# Patient Record
Sex: Male | Born: 1950 | Race: White | Hispanic: No | Marital: Married | Smoking: Never smoker
Health system: Southern US, Community
[De-identification: ages and names within clinical notes are randomized; demographics above are authoritative.]

---

## 2009-08-14 HISTORY — PX: CHOLECYSTECTOMY: SHX55

## 2010-05-18 ENCOUNTER — Inpatient Hospital Stay (HOSPITAL_COMMUNITY): Admission: EM | Admit: 2010-05-18 | Discharge: 2010-05-20 | Payer: Self-pay | Admitting: Emergency Medicine

## 2010-08-03 ENCOUNTER — Ambulatory Visit (HOSPITAL_COMMUNITY)
Admission: RE | Admit: 2010-08-03 | Discharge: 2010-08-03 | Payer: Self-pay | Source: Home / Self Care | Attending: General Surgery | Admitting: General Surgery

## 2010-10-24 LAB — COMPREHENSIVE METABOLIC PANEL
ALT: 32 U/L (ref 0–53)
Alkaline Phosphatase: 55 U/L (ref 39–117)
BUN: 14 mg/dL (ref 6–23)
CO2: 27 mEq/L (ref 19–32)
Creatinine, Ser: 1.03 mg/dL (ref 0.4–1.5)
GFR calc non Af Amer: >60 mL/min (ref >60)
Glucose, Bld: 92 mg/dL (ref 70–99)
Potassium: 4.4 mEq/L (ref 3.5–5.1)
Sodium: 140 mEq/L (ref 135–145)
Total Bilirubin: 0.7 mg/dL (ref 0.3–1.2)
Total Protein: 7.5 g/dL (ref 6.0–8.3)

## 2010-10-24 LAB — CBC
HCT: 44.9 % (ref 39.0–52.0)
Hemoglobin: 15.6 g/dL (ref 13.0–17.0)
MCH: 32.1 pg (ref 26.0–34.0)
MCHC: 34.7 g/dL (ref 30.0–36.0)
WBC: 6.8 10*3/uL (ref 4.0–10.5)

## 2010-10-24 LAB — DIFFERENTIAL
Basophils Relative: 1 % (ref 0–1)
Eosinophils Absolute: 0.1 10*3/uL (ref 0.0–0.7)
Neutrophils Relative %: 60 % (ref 43–77)

## 2010-10-24 LAB — SURGICAL PCR SCREEN: Staphylococcus aureus: POSITIVE — AB

## 2010-10-24 LAB — LIPASE, BLOOD: Lipase: 32 U/L (ref 11–59)

## 2010-10-27 LAB — CBC
Hemoglobin: 13.5 g/dL (ref 13.0–17.0)
MCH: 31.8 pg (ref 26.0–34.0)
MCH: 32.7 pg (ref 26.0–34.0)
MCHC: 34.7 g/dL (ref 30.0–36.0)
MCHC: 35.1 g/dL (ref 30.0–36.0)
Platelets: 189 10*3/uL (ref 150–400)
Platelets: 231 10*3/uL (ref 150–400)
RDW: 12.8 % (ref 11.5–15.5)

## 2010-10-27 LAB — PROTIME-INR
INR: 1.05 (ref 0.00–1.49)
Prothrombin Time: 13.9 seconds (ref 11.6–15.2)

## 2010-10-27 LAB — COMPREHENSIVE METABOLIC PANEL
ALT: 285 U/L — ABNORMAL HIGH (ref 0–53)
AST: 142 U/L — ABNORMAL HIGH (ref 0–37)
AST: 52 U/L — ABNORMAL HIGH (ref 0–37)
AST: 66 U/L — ABNORMAL HIGH (ref 0–37)
Albumin: 3.1 g/dL — ABNORMAL LOW (ref 3.5–5.2)
Albumin: 3.8 g/dL (ref 3.5–5.2)
BUN: 20 mg/dL (ref 6–23)
BUN: 8 mg/dL (ref 6–23)
CO2: 25 mEq/L (ref 19–32)
CO2: 27 mEq/L (ref 19–32)
Calcium: 8.5 mg/dL (ref 8.4–10.5)
Calcium: 8.8 mg/dL (ref 8.4–10.5)
Calcium: 9.6 mg/dL (ref 8.4–10.5)
Chloride: 111 mEq/L (ref 96–112)
Creatinine, Ser: 1.16 mg/dL (ref 0.4–1.5)
Creatinine, Ser: 1.2 mg/dL (ref 0.4–1.5)
Creatinine, Ser: 1.31 mg/dL (ref 0.4–1.5)
GFR calc Af Amer: >60 mL/min (ref >60)
GFR calc Af Amer: >60 mL/min (ref >60)
GFR calc Af Amer: >60 mL/min (ref >60)
GFR calc non Af Amer: 56 mL/min — ABNORMAL LOW (ref >60)
GFR calc non Af Amer: >60 mL/min (ref >60)
GFR calc non Af Amer: >60 mL/min (ref >60)
Sodium: 140 mEq/L (ref 135–145)
Total Bilirubin: 1.3 mg/dL — ABNORMAL HIGH (ref 0.3–1.2)
Total Protein: 5.9 g/dL — ABNORMAL LOW (ref 6.0–8.3)

## 2010-10-27 LAB — URINALYSIS, ROUTINE W REFLEX MICROSCOPIC
Nitrite: NEGATIVE
Specific Gravity, Urine: 1.031 — ABNORMAL HIGH (ref 1.005–1.030)
Urobilinogen, UA: 0.2 mg/dL (ref 0.0–1.0)
pH: 5.5 (ref 5.0–8.0)

## 2010-10-27 LAB — DIFFERENTIAL
Eosinophils Relative: 2 % (ref 0–5)
Lymphocytes Relative: 24 % (ref 12–46)
Lymphs Abs: 2.1 10*3/uL (ref 0.7–4.0)
Neutro Abs: 5.9 10*3/uL (ref 1.7–7.7)

## 2010-10-27 LAB — LIPID PANEL
HDL: 35 mg/dL — ABNORMAL LOW (ref >39)
LDL Cholesterol: 98 mg/dL (ref 0–99)
Triglycerides: 103 mg/dL (ref <150)
VLDL: 21 mg/dL (ref 0–40)

## 2010-10-27 LAB — APTT: aPTT: 27 seconds (ref 24–37)

## 2010-10-27 LAB — LIPASE, BLOOD: Lipase: 386 U/L — ABNORMAL HIGH (ref 11–59)

## 2016-08-14 HISTORY — PX: INGUINAL HERNIA REPAIR: SUR1180

## 2016-11-01 ENCOUNTER — Ambulatory Visit: Payer: Self-pay | Admitting: Surgery

## 2016-11-01 ENCOUNTER — Encounter: Payer: Self-pay | Admitting: Surgery

## 2016-11-01 NOTE — H&P (Signed)
Anthony Shaffer 11/01/2016 4:20 PM Location: [facility] Patient #: [id number] DOB: [birth year] Separated / Language: [place] / Race: White Male  History of Present Illness Anthony Sportsman MD; 11/01/2016 5:05 PM) The patient is a 66 year old male who presents with an inguinal hernia. Note for "Inguinal hernia": Patient sent for surgical consultation at the request of Dr. Henrine Screws, primary care physician. Concern for large right inguinal hernia.  Pleasant active male. 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 Had of left inguinal hernia repair done in an open fashion with mesh when he worked up and [employer], about 20 years ago. 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 Had gallstone pancreatitis with a cholecystectomy 2011 bilateral partner, Dr. Emelia Loron. I started some groin swelling over the past few months on the right side. Worse when lifting and activity. Concern for inguinal hernia on the right side. Saw primary care physician. Suspicion confirmed. Surgical consultation requested. Patient wonders if he has a recurrence on the left side as well although denies really any lump or significant pain.  Patient usually is on the treadmill at least 15 minutes a day. Moderately active. Moves his bowels every day. He does not smoke. No history of cardiopulmonary events. No history of skin infections or wound infections. Usually has to get up at night to urinate a couple times a night but nothing too severe. He is not on any prostate medications. He recalls many family members having hernia repairs. He wonders if this is genetic.   Past Surgical History Anthony Shaffer, [place]; 11/01/2016 4:20 PM) Gallbladder Surgery - Laparoscopic Open Inguinal Hernia Surgery Left.  Diagnostic Studies History Anthony Shaffer, [place]; 11/01/2016 4:20 PM) Colonoscopy never  Allergies Anthony Shaffer [place], [place]; 11/01/2016 4:20 PM) No Known Drug Allergies  11/01/2016 No Known Allergies 11/01/2016 Allergies Reconciled  Medication History Anthony Shaffer, CMA; 11/01/2016 4:21 PM) Atorvastatin Calcium (10MG  Tablet, Oral daily) Active. Multi-Minerals (Oral daily) Active. Vitamin D (Cholecalciferol) (1000UNIT Capsule, Oral daily) Active. Medications Reconciled  Social History Anthony Sportsman, MD; 11/01/2016 4:41 PM) Alcohol use Occasional alcohol use. Caffeine use Tea. No drug use Tobacco use Former smoker. 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 Skin Not Present- Change in Wart/Mole, Dryness, Hives, Jaundice, New Lesions, Non-Healing Wounds, Rash and Ulcer. HEENT Not Present- Earache, Hearing Loss, Hoarseness, Nose Bleed, Oral Ulcers, Ringing in the Ears, Seasonal Allergies, Sinus Pain, Sore Throat, Visual Disturbances, Wears glasses/contact lenses and Yellow Eyes. Respiratory Not Present- Bloody sputum, Chronic Cough, Difficulty Breathing, Snoring and Wheezing. Breast Not Present- Breast Mass, Breast Pain, Nipple Discharge and Skin Changes. Cardiovascular Not Present- Chest Pain, Difficulty Breathing Lying Down, Leg Cramps, Palpitations, Rapid Heart Rate, Shortness of Breath and Swelling of Extremities. Gastrointestinal Not Present- Abdominal Pain, Bloating, Bloody Stool, Change in Bowel Habits, Chronic diarrhea, Constipation, Difficulty Swallowing, Excessive gas, Gets full quickly at meals, Hemorrhoids, Indigestion, Nausea, Rectal Pain and Vomiting. Male Genitourinary Not Present- Blood in Urine, Change in Urinary Stream, Frequency, Impotence, Nocturia, Painful Urination,  Urgency and Urine Leakage. Musculoskeletal Not Present- Back Pain, Joint Pain, Joint Stiffness, Muscle Pain, Muscle Weakness and Swelling of Extremities. Neurological Not Present- Decreased Memory, Fainting, Headaches, Numbness, Seizures, Tingling,  Tremor, Trouble walking and Weakness. Psychiatric Not Present- Anxiety, Bipolar, Change in Sleep Pattern, Depression, Fearful and Frequent crying. Endocrine Not Present- Cold Intolerance, Excessive Hunger, Hair Changes, Heat Intolerance, Hot flashes and New Diabetes. Hematology Not Present- Blood Thinners, Easy Bruising, Excessive bleeding, Gland problems, HIV and Persistent Infections.  Vitals Anthony Shaffer [place] CMA; 11/01/2016 4:21 PM) 11/01/2016 4:21 PM Weight: 184 lb Height: 68in Body Surface Area: 1.97 m Body Mass Index: 27.98 kg/m  Temp.: 98.4F  Pulse: 68 (Regular)  BP: 112/62 (Sitting, Left Arm, Standard)      Physical Exam Anthony Sportsman MD; 11/01/2016 5:02 PM)  Abdomen Note: Obvious periumbilical hernia. 1 cm reducible. No diastases. Well-healed laparoscopic incisions. Minimal diastases recti supraumbilically. No guarding.  Male Genitourinary Note: Obvious right groin bulge consistent with moderate inguinal hernia going down into proximal scrotum. Ultimately reducible. Sensitive but not severe. Possible subtle impulse left lateral groin but no major recurrent hernia.  Normal external genitalia. Epididymi, testes, and spermatic cords normal without any masses.    Assessment & Plan Anthony Sportsman MD; 11/01/2016 5:02 PM)  RIGHT INGUINAL HERNIA (K40.90) Impression: Moderate size right inguinal hernia going into proximal scrotum. Ultimately reducible. No definite hernia on the left side but some sensitivity laterally. Small incisional hernia at bellybutton.  Think he would benefit from laparoscopic repair of hernias found. Umbilical incisional hernia most likely could be repaired with primary repair. If he is interested  in proceeding with surgery at some point. He is concerned he has a very busy schedule. Even the summertime is filling up. He will look at dates and call us back. Most likely he will wait until after the spring semester is over in the next 6-8 weeks.  INCISIONAL HERNIA, WITHOUT OBSTRUCTION OR GANGRENE (K43.2) Impression: Small periumbilical incisional hernia most likely related to the cholecystectomy done urgently 7 years ago. Probably just needs primary repair only  PREOP - ING HERNIA - ENCOUNTER FOR PREOPERATIVE EXAMINATION FOR GENERAL SURGICAL PROCEDURE (Z01.818)  Current Plans You are being scheduled for surgery- Our schedulers will call you.  You should hear from our office's scheduling department within 5 working days about the location, date, and time of surgery. We try to make accommodations for patient's preferences in scheduling surgery, but sometimes the OR schedule or the surgeon's schedule prevents [place] from making those accommodations.  If you have not heard from our office [phone]) in 5 working days, call the office and ask for your surgeon's nurse.  If you have other questions about your diagnosis, plan, or surgery, call the office and ask for your surgeon's nurse.  Written instructions provided The anatomy & physiology of the abdominal wall and pelvic floor was discussed. The pathophysiology of hernias in the inguinal and pelvic region was discussed. Natural history risks such as progressive enlargement, pain, incarceration, and strangulation was discussed. Contributors to complications such as smoking, obesity, diabetes, prior surgery, etc were discussed.  I feel the risks of no intervention will lead to serious problems that outweigh the operative risks; therefore, I recommended surgery to reduce and repair the hernia. I explained laparoscopic techniques with possible need for an open approach. I noted usual use of mesh to patch and/or buttress hernia  repair  Risks such as bleeding, infection, abscess, need for further treatment, heart attack, death, and other risks were discussed. I noted a good likelihood this will help address the problem. Goals of post-operative recovery were discussed as well. Possibility that this will not correct all symptoms was explained. I stressed  the importance of low-impact activity, aggressive pain control, avoiding constipation, & not pushing through pain to minimize risk of post-operative chronic pain or injury. Possibility of reherniation was discussed. We will work to minimize complications.  An educational handout further explaining the pathology & treatment options was given as well. Questions were answered. The patient expresses understanding & wishes to proceed with surgery.  Pt Education - Pamphlet Given - Laparoscopic Hernia Repair: discussed with patient and provided information. Pt Education - [facility] Pain Control (Mickenzie Stolar) Pt Education - [facility] Hernia Post-Op HCI (Conleigh Heinlein): discussed with patient and provided information.  Anthony SportsmanSteven C. Kitrina Maurin, M.D., F.A.C.S. Gastrointestinal and Minimally Invasive Surgery [facility] [address] [place], [place] [id number] [phone] Main / Paging

## 2018-08-16 ENCOUNTER — Encounter: Payer: Self-pay | Admitting: Family Medicine

## 2018-08-16 ENCOUNTER — Ambulatory Visit: Payer: Self-pay | Admitting: Family Medicine

## 2018-08-16 ENCOUNTER — Ambulatory Visit (INDEPENDENT_AMBULATORY_CARE_PROVIDER_SITE_OTHER): Payer: BC Managed Care – PPO | Admitting: Family Medicine

## 2018-08-16 DIAGNOSIS — M545 Low back pain, unspecified: Secondary | ICD-10-CM | POA: Insufficient documentation

## 2018-08-16 MED ORDER — CYCLOBENZAPRINE HCL 10 MG PO TABS: 10.0000 mg | ORAL_TABLET | Freq: Three times a day (TID) | ORAL | 0 refills | Status: AC | PRN

## 2018-08-16 MED ORDER — MELOXICAM 15 MG PO TABS: 15.0000 mg | ORAL_TABLET | Freq: Every day | ORAL | 0 refills | Status: AC

## 2018-08-16 NOTE — Progress Notes (Signed)
Anthony Shaffer - 68 y.o. male MRN [id number]  Date of birth: November 25, 1950  Subjective Chief Complaint  Patient presents with  . Establish Care    Lower back pain-worse with pain. He has been using [place] to the area. Denies injury.     HPI Anthony Shaffer is a 68 y.o. male here today with complaint of low back pain.  He reports that he has had back pain off and on for the past couple of months.  Has had relief with ben gay and aleve in the past.  Most recent episode has been a little worse and he has not gotten good relief with aleve.  Pain is located on R side and he denies radiation of pain, numbness or tingling.  He does not have any weakness of the leg.    ROS:  A comprehensive ROS was completed and negative except as noted per HPI  No Known Allergies  History reviewed. No pertinent past medical history.  Past Surgical History:  Procedure Laterality Date  . CHOLECYSTECTOMY  2011  . INGUINAL HERNIA REPAIR  2018    Social History   Socioeconomic History  . Marital status: Married    Spouse name: Not on file  . Number of children: Not on file  . Years of education: Not on file  . Highest education level: Not on file  Occupational History  . Not on file  Social Needs  . Financial resource strain: Not on file  . Food insecurity:    Worry: Not on file    Inability: Not on file  . Transportation needs:    Medical: Not on file    Non-medical: Not on file  Tobacco Use  . Smoking status: Never Smoker  . Smokeless tobacco: Never Used  Substance and Sexual Activity  . Alcohol use: Yes  . Drug use: Not on file  . Sexual activity: Not on file  Lifestyle  . Physical activity:    Days per week: Not on file    Minutes per session: Not on file  . Stress: Not on file  Relationships  . Social connections:    Talks on phone: Not on file    Gets together: Not on file    Attends religious service: Not on file    Active member of club or organization: Not on file    Attends  meetings of clubs or organizations: Not on file    Relationship status: Not on file  Other Topics Concern  . 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   [place] for decades   He used to live up and [employer].    No family history on file.  Health Maintenance  Topic Date Due  . Hepatitis C Screening  05/24/1951  . TETANUS/TDAP  03/05/1970  . COLONOSCOPY  03/05/2001  . PNA vac Low Risk Adult (1 of 2 - PCV13) 03/05/2016  . INFLUENZA VACCINE  11/12/2018 (Originally 03/14/2018)    ----------------------------------------------------------------------------------------------------------------------------------------------------------------------------------------------------------------- Physical Exam BP 134/66   Pulse 70   Temp 97.6 F (36.4 C) (Oral)   Ht 5\' 6"  (1.676 m)   Wt 191 lb (86.6 kg)   SpO2 96%   BMI 30.83 kg/m   Physical Exam Constitutional:      Appearance: Normal appearance. He is not ill-appearing.  HENT:     Head: Normocephalic and atraumatic.     Mouth/Throat:     Mouth:  Mucous membranes are moist.  Cardiovascular:     Rate and Rhythm: Normal rate and regular rhythm.  Pulmonary:     Effort: Pulmonary effort is normal.     Breath sounds: Normal breath sounds.  Musculoskeletal:     Comments: L spine ROM is normal.  Some pain with rotational movement to the R and single leg hyperextension to the R.  Strength and sensation normal.   Neurological:     General: No focal deficit present.     Mental Status: He is alert.  Psychiatric:        Mood and Affect: Mood normal.        Behavior: Behavior normal.     ------------------------------------------------------------------------------------------------------------------------------------------------------------------------------------------------------------------- Assessment and Plan  Low back pain -Meloxicam to replace  aleve -Add on flexeril as needed.  -Given handout for HEP/Stretches

## 2018-08-16 NOTE — Patient Instructions (Signed)
Low Back Sprain Rehab  Ask your health care provider which exercises are safe for you. Do exercises exactly as told by your health care provider and adjust them as directed. It is normal to feel mild stretching, pulling, tightness, or discomfort as you do these exercises, but you should stop right away if you feel sudden pain or your pain gets worse. Do not begin these exercises until told by your health care provider.  Stretching and range of motion exercises  These exercises warm up your muscles and joints and improve the movement and flexibility of your back. These exercises also help to relieve pain, numbness, and tingling.  Exercise A: Lumbar rotation    1. Lie on your back on a firm surface and bend your knees.  2. Straighten your arms out to your sides so each arm forms an "L" shape with a side of your body (a 90 degree angle).  3. Slowly move both of your knees to one side of your body until you feel a stretch in your lower back. Try not to let your shoulders move off of the floor.  4. Hold for __________ seconds.  5. Tense your abdominal muscles and slowly move your knees back to the starting position.  6. Repeat this exercise on the other side of your body.  Repeat __________ times. Complete this exercise __________ times a day.  Exercise B: Prone extension on elbows    1. Lie on your abdomen on a firm surface.  2. Prop yourself up on your elbows.  3. Use your arms to help lift your chest up until you feel a gentle stretch in your abdomen and your lower back.  ? This will place some of your body weight on your elbows. If this is uncomfortable, try stacking pillows under your chest.  ? Your hips should stay down, against the surface that you are lying on. Keep your hip and back muscles relaxed.  4. Hold for __________ seconds.  5. Slowly relax your upper body and return to the starting position.  Repeat __________ times. Complete this exercise __________ times a day.  Strengthening exercises  These  exercises build strength and endurance in your back. Endurance is the ability to use your muscles for a long time, even after they get tired.  Exercise C: Pelvic tilt  1. Lie on your back on a firm surface. Bend your knees and keep your feet flat.  2. Tense your abdominal muscles. Tip your pelvis up toward the ceiling and flatten your lower back into the floor.  ? To help with this exercise, you may place a small towel under your lower back and try to push your back into the towel.  3. Hold for __________ seconds.  4. Let your muscles relax completely before you repeat this exercise.  Repeat __________ times. Complete this exercise __________ times a day.  Exercise D: Alternating arm and leg raises    1. Get on your hands and knees on a firm surface. If you are on a hard floor, you may want to use padding to cushion your knees, such as an exercise mat.  2. Line up your arms and legs. Your hands should be below your shoulders, and your knees should be below your hips.  3. Lift your left leg behind you. At the same time, raise your right arm and straighten it in front of you.  ? Do not lift your leg higher than your hip.  ? Do not lift your arm   higher than your shoulder.  ? Keep your abdominal and back muscles tight.  ? Keep your hips facing the ground.  ? Do not arch your back.  ? Keep your balance carefully, and do not hold your breath.  4. Hold for __________ seconds.  5. Slowly return to the starting position and repeat with your right leg and your left arm.  Repeat __________ times. Complete this exercise __________ times a day.  Exercise E: Abdominal set with straight leg raise    1. Lie on your back on a firm surface.  2. Bend one of your knees and keep your other leg straight.  3. Tense your abdominal muscles and lift your straight leg up, 4-6 inches (10-15 cm) off the ground.  4. Keep your abdominal muscles tight and hold for __________ seconds.  ? Do not hold your breath.  ? Do not arch your back. Keep it  flat against the ground.  5. Keep your abdominal muscles tense as you slowly lower your leg back to the starting position.  6. Repeat with your other leg.  Repeat __________ times. Complete this exercise __________ times a day.  Posture and body mechanics    Body mechanics refers to the movements and positions of your body while you do your daily activities. Posture is part of body mechanics. Good posture and healthy body mechanics can help to relieve stress in your body's tissues and joints. Good posture means that your spine is in its natural S-curve position (your spine is neutral), your shoulders are pulled back slightly, and your head is not tipped forward. The following are general guidelines for applying improved posture and body mechanics to your everyday activities.  Standing    · When standing, keep your spine neutral and your feet about hip-width apart. Keep a slight bend in your knees. Your ears, shoulders, and hips should line up.  · When you do a task in which you stand in one place for a long time, place one foot up on a stable object that is 2-4 inches (5-10 cm) high, such as a footstool. This helps keep your spine neutral.  Sitting    · When sitting, keep your spine neutral and keep your feet flat on the floor. Use a footrest, if necessary, and keep your thighs parallel to the floor. Avoid rounding your shoulders, and avoid tilting your head forward.  · When working at a desk or a computer, keep your desk at a height where your hands are slightly lower than your elbows. Slide your chair under your desk so you are close enough to maintain good posture.  · When working at a computer, place your monitor at a height where you are looking straight ahead and you do not have to tilt your head forward or downward to look at the screen.  Resting    · When lying down and resting, avoid positions that are most painful for you.  · If you have pain with activities such as sitting, bending, stooping, or squatting  (flexion-based activities), lie in a position in which your body does not bend very much. For example, avoid curling up on your side with your arms and knees near your chest (fetal position).  · If you have pain with activities such as standing for a long time or reaching with your arms (extension-based activities), lie with your spine in a neutral position and bend your knees slightly. Try the following positions:  · Lying on your side with a   pillow between your knees.  · Lying on your back with a pillow under your knees.  Lifting    · When lifting objects, keep your feet at least shoulder-width apart and tighten your abdominal muscles.  · Bend your knees and hips and keep your spine neutral. It is important to lift using the strength of your legs, not your back. Do not lock your knees straight out.  · Always ask for help to lift heavy or awkward objects.  This information is not intended to replace advice given to you by your health care provider. Make sure you discuss any questions you have with your health care provider.  Document Released: 07/31/2005 Document Revised: 04/06/2016 Document Reviewed: 05/12/2015  Elsevier Interactive Patient Education © 2019 [employer]

## 2018-08-16 NOTE — Assessment & Plan Note (Signed)
-  Meloxicam to replace aleve -Add on flexeril as needed.  -Given handout for HEP/Stretches

## 2021-11-09 ENCOUNTER — Other Ambulatory Visit (HOSPITAL_COMMUNITY): Payer: Self-pay | Admitting: Family Medicine

## 2021-11-09 DIAGNOSIS — E785 Hyperlipidemia, unspecified: Secondary | ICD-10-CM

## 2021-11-09 DIAGNOSIS — E7800 Pure hypercholesterolemia, unspecified: Secondary | ICD-10-CM

## 2021-11-23 ENCOUNTER — Ambulatory Visit (HOSPITAL_COMMUNITY)
Admission: RE | Admit: 2021-11-23 | Discharge: 2021-11-23 | Disposition: A | Discharge status: Home / Self Care | Payer: Self-pay | Source: Ambulatory Visit | Attending: Family Medicine | Admitting: Family Medicine

## 2021-11-23 DIAGNOSIS — E785 Hyperlipidemia, unspecified: Secondary | ICD-10-CM | POA: Insufficient documentation

## 2021-11-23 DIAGNOSIS — E7800 Pure hypercholesterolemia, unspecified: Secondary | ICD-10-CM | POA: Insufficient documentation

## 2022-11-13 ENCOUNTER — Other Ambulatory Visit: Payer: Self-pay | Admitting: Family Medicine

## 2022-11-13 DIAGNOSIS — R911 Solitary pulmonary nodule: Secondary | ICD-10-CM

## 2022-12-20 ENCOUNTER — Ambulatory Visit
Admission: RE | Admit: 2022-12-20 | Discharge: 2022-12-20 | Disposition: A | Discharge status: Home / Self Care | Payer: BC Managed Care – PPO | Source: Ambulatory Visit | Attending: Family Medicine | Admitting: Family Medicine

## 2022-12-20 DIAGNOSIS — R911 Solitary pulmonary nodule: Secondary | ICD-10-CM

## 2023-09-25 IMAGING — CT CT CARDIAC CORONARY ARTERY CALCIUM SCORE
2 series · 13 of 20 positions shown, 15 images · non-contrast
Comparison: None.

Addendum:
:
Cardiovascular Disease Risk stratification

EXAM:
Coronary Calcium Score
TECHNIQUE: A gated, non-contrast computed tomography scan of the heart was
performed using 3mm slice thickness. Axial images were analyzed on a
dedicated workstation. Calcium scoring of the coronary arteries was
performed using the Agatston method.

[Series 3: cascseq 2.0 b35f 70% · axial · 0.39mm/px · z∈[+1224,+1284]mm · 5 of 69 slices shown]
[im 8/69  vessel]
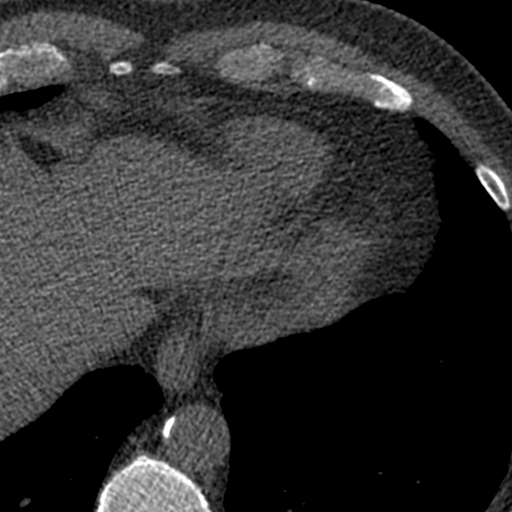
[im 16/69  vessel]
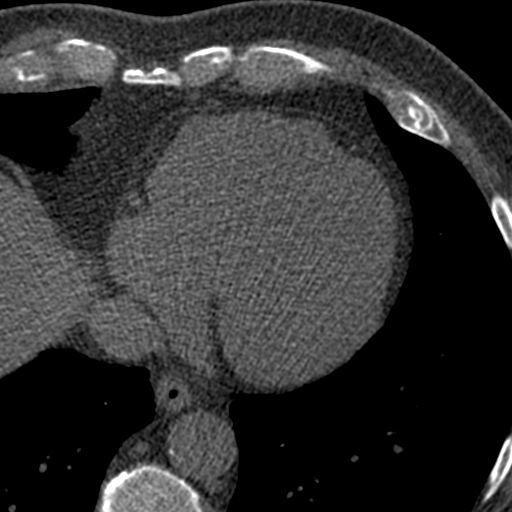
[im 23/69  vessel]
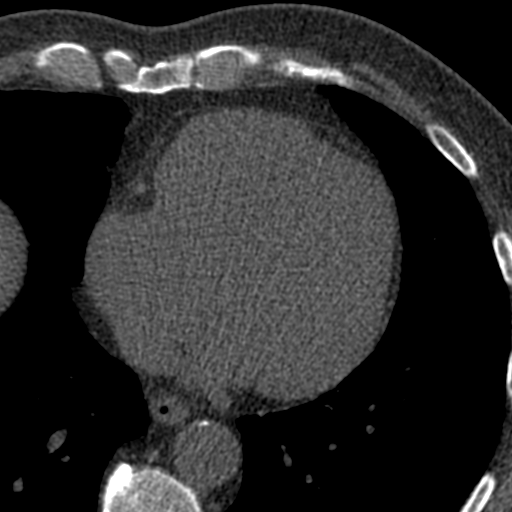
[im 31/69  vessel]
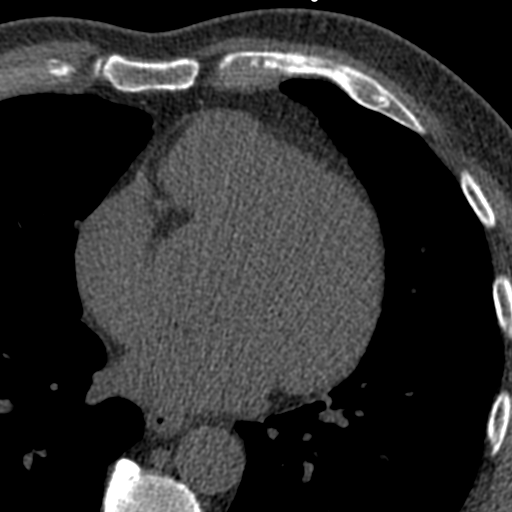
[im 38/69  vessel]
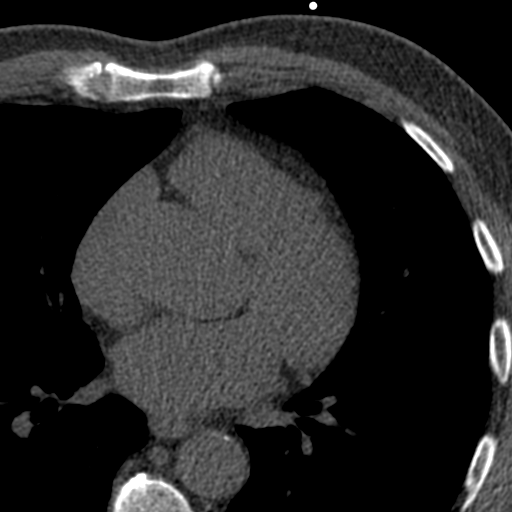

[Series 4: ax st full fov · axial · 0.94mm/px · z∈[+1224,+1330]mm · 8 of 69 slices shown, 10 images]
[im 8/69  vessel]
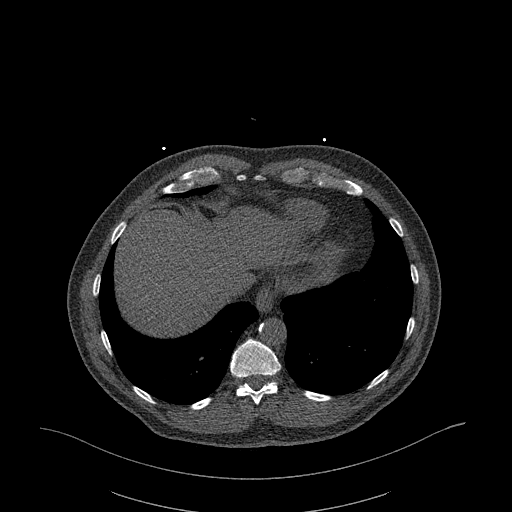
[im 8/69  lung]
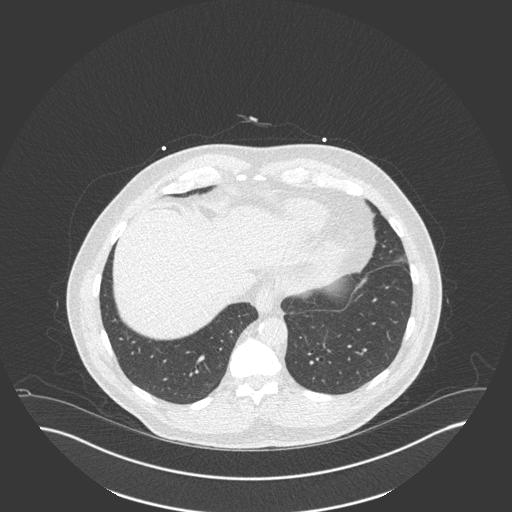
[im 16/69  vessel]
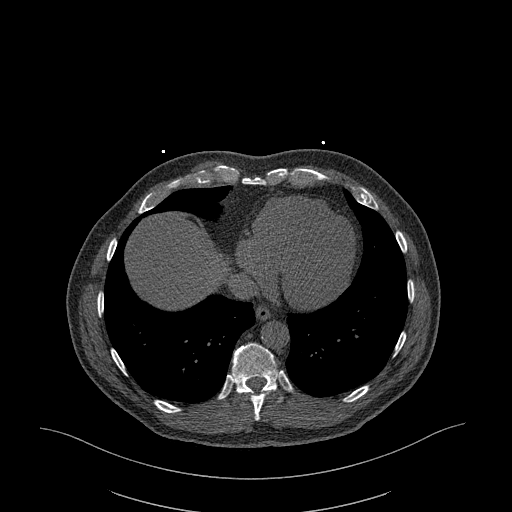
[im 23/69  vessel]
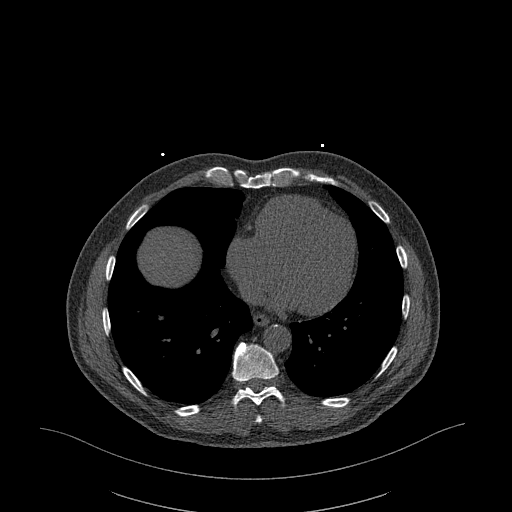
[im 31/69  vessel]
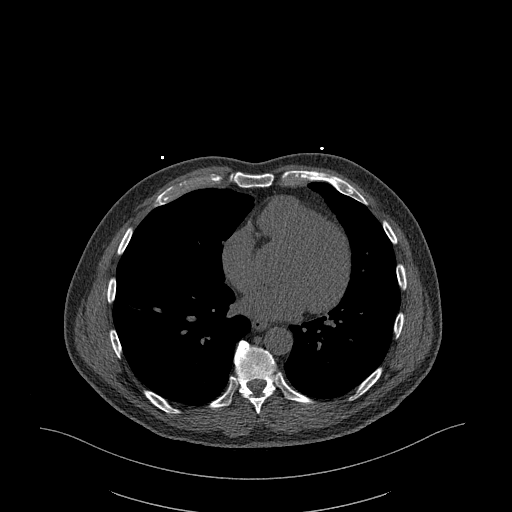
[im 38/69  vessel]
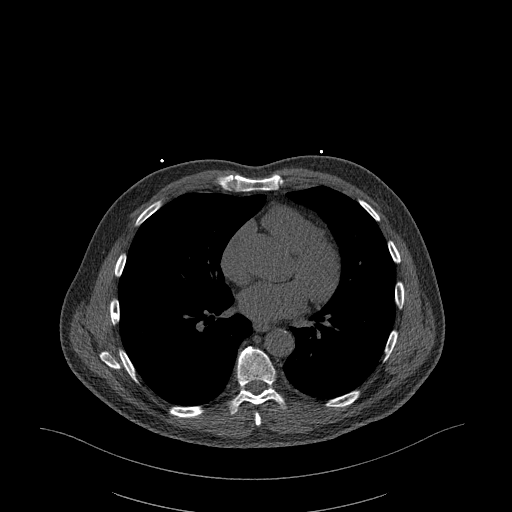
[im 38/69  lung]
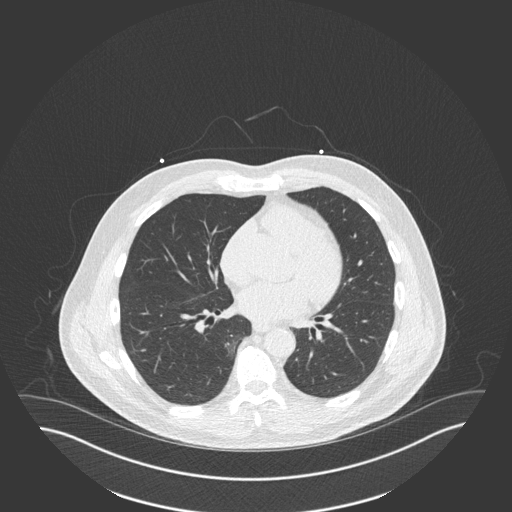
[im 46/69  vessel]
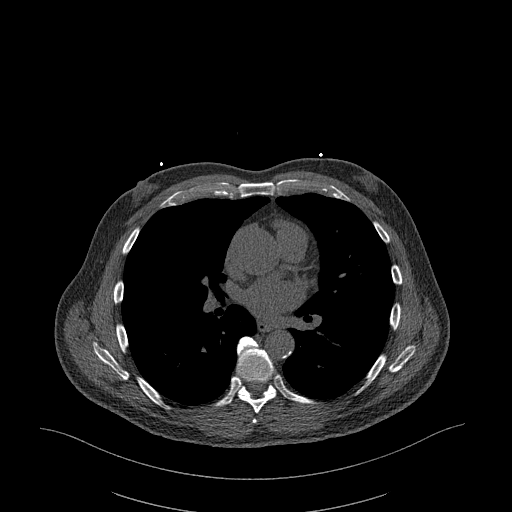
[im 53/69  vessel]
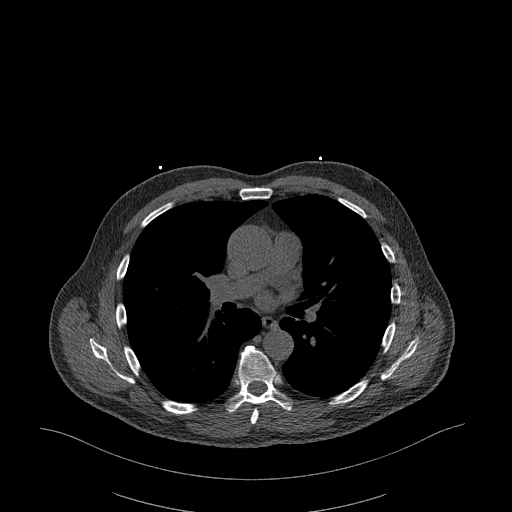
[im 61/69  vessel]
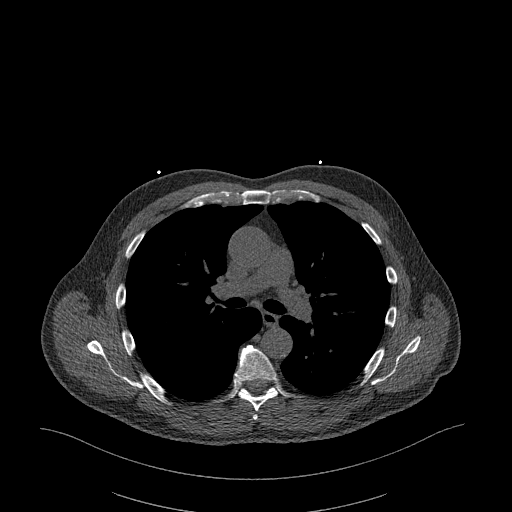

[13 of 20 positions shown; findings below may reference images not displayed]

FINDINGS: Coronary arteries: Normal origins.

Coronary Calcium Score:

Left main: 0

Left anterior descending artery:

Left circumflex artery:

Right coronary artery:

Total:

Percentile: 57

Pericardium: Normal.

Ascending Aorta: Normal caliber.

Non-cardiac: See separate report from [REDACTED].
IMPRESSION: Coronary calcium score of 30.7. This was 57 percentile for age-,
race-, and sex-matched controls.



If CAC=0, it is reasonable to withhold statin therapy and reassess
in 5 to 10 years, as long as higher risk conditions are absent
(diabetes mellitus, family history of premature CHD in first degree
relatives (males <55 years; females <65 years), cigarette smoking,
or LDL >=190 mg/dL).

If CAC is 1 to 99, it is reasonable to initiate statin therapy for
patients >=55 years of age.

If CAC is >=100 or >=75th percentile, it is reasonable to initiate
statin therapy at any age.

Cardiology referral should be considered for patients with CAC
scores >=400 or >=75th percentile.

*0846 AHA/ACC/AACVPR/AAPA/ABC/TIGER/GEOVANY EFRAIN/MARINE/Lanka/FELICISIMA/JOANNA/AKIKO
Guideline on the Management of Blood Cholesterol: A Report of the
American College of Cardiology/American Heart Association Task Force
on Clinical Practice Guidelines. J Am Coll Cardiol.
1620;73(24):5233-5009.

Ferienhaus Erxleben, DO

The noncardiac portion of this study will be interpreted in separate
report by the radiologist.

EXAM:
OVER-READ INTERPRETATION  CT CHEST

The following report is an over-read performed by radiologist Dr.
over-read does not include interpretation of cardiac or coronary
anatomy or pathology. The coronary calcium score interpretation by
the cardiologist is attached.
FINDINGS: Vascular: Normal heart size. No pericardial effusion. Normal caliber
thoracic aorta with atherosclerotic disease.

Mediastinum/Nodes: Esophagus is unremarkable. No pathologically
enlarged lymph nodes seen in the chest.

Lungs/Pleura: Central airways are patent. No consolidation, pleural
effusion or pneumothorax. Solid pulmonary nodule of the right lower
lobe measuring 5 mm in mean diameter on series 5, image 30.

Upper Abdomen: No acute abnormality.

Musculoskeletal: No chest wall mass or suspicious bone lesions
identified.
IMPRESSION: Solid pulmonary nodule of the right lower lobe measuring 5 mm in
mean diameter. No follow-up needed if patient is low-risk.This
recommendation follows the consensus statement: Guidelines for
Management of Incidental Pulmonary Nodules Detected on CT Images:

*** End of Addendum ***
:
Cardiovascular Disease Risk stratification

EXAM:
Coronary Calcium Score
FINDINGS: Coronary arteries: Normal origins.

Coronary Calcium Score:

Left main: 0

Left anterior descending artery:

Left circumflex artery:

Right coronary artery:

Total:

Percentile: 57

Pericardium: Normal.

Ascending Aorta: Normal caliber.

Non-cardiac: See separate report from [REDACTED].
IMPRESSION: Coronary calcium score of 30.7. This was 57 percentile for age-,
race-, and sex-matched controls.



If CAC=0, it is reasonable to withhold statin therapy and reassess
in 5 to 10 years, as long as higher risk conditions are absent
(diabetes mellitus, family history of premature CHD in first degree
relatives (males <55 years; females <65 years), cigarette smoking,
or LDL >=190 mg/dL).

If CAC is 1 to 99, it is reasonable to initiate statin therapy for
patients >=55 years of age.

If CAC is >=100 or >=75th percentile, it is reasonable to initiate
statin therapy at any age.

Cardiology referral should be considered for patients with CAC
scores >=400 or >=75th percentile.

*0846 AHA/ACC/AACVPR/AAPA/ABC/TIGER/GEOVANY EFRAIN/MARINE/Lanka/FELICISIMA/JOANNA/AKIKO
Guideline on the Management of Blood Cholesterol: A Report of the
American College of Cardiology/American Heart Association Task Force
on Clinical Practice Guidelines. J Am Coll Cardiol.
1620;73(24):5233-5009.

Ferienhaus Erxleben, DO

The noncardiac portion of this study will be interpreted in separate
report by the radiologist.

## 2024-03-07 ENCOUNTER — Other Ambulatory Visit (HOSPITAL_COMMUNITY): Payer: Self-pay | Admitting: Family Medicine

## 2024-03-07 DIAGNOSIS — I719 Aortic aneurysm of unspecified site, without rupture: Secondary | ICD-10-CM
